# Patient Record
Sex: Male | Born: 1960 | Race: Black or African American | Hispanic: No | Marital: Married | Smoking: Never smoker
Health system: Southern US, Community
[De-identification: ages and names within clinical notes are randomized; demographics above are authoritative.]

---

## 2001-07-27 ENCOUNTER — Emergency Department (HOSPITAL_COMMUNITY): Admission: EM | Admit: 2001-07-27 | Discharge: 2001-07-27 | Payer: Self-pay | Admitting: Emergency Medicine

## 2003-08-28 ENCOUNTER — Emergency Department (HOSPITAL_COMMUNITY): Admission: EM | Admit: 2003-08-28 | Discharge: 2003-08-28 | Payer: Self-pay | Admitting: Emergency Medicine

## 2004-09-04 ENCOUNTER — Emergency Department (HOSPITAL_COMMUNITY): Admission: EM | Admit: 2004-09-04 | Discharge: 2004-09-05 | Payer: Self-pay | Admitting: Emergency Medicine

## 2004-09-22 ENCOUNTER — Emergency Department (HOSPITAL_COMMUNITY): Admission: EM | Admit: 2004-09-22 | Discharge: 2004-09-23 | Payer: Self-pay | Admitting: Emergency Medicine
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
## 2014-05-01 ENCOUNTER — Emergency Department (HOSPITAL_COMMUNITY)
Admission: EM | Admit: 2014-05-01 | Discharge: 2014-05-01 | Disposition: A | Discharge status: Home / Self Care | Payer: Self-pay | Attending: Emergency Medicine | Admitting: Emergency Medicine

## 2014-05-01 ENCOUNTER — Encounter (HOSPITAL_COMMUNITY): Payer: Self-pay | Admitting: Nurse Practitioner

## 2014-05-01 DIAGNOSIS — J069 Acute upper respiratory infection, unspecified: Secondary | ICD-10-CM | POA: Insufficient documentation

## 2014-05-01 MED ORDER — DM-GUAIFENESIN ER 30-600 MG PO TB12: 1.0000 | ORAL_TABLET | Freq: Two times a day (BID) | ORAL | Status: DC

## 2014-05-01 MED ORDER — ACETAMINOPHEN 325 MG PO TABS
650.0000 mg | ORAL_TABLET | Freq: Once | ORAL | Status: AC
  Administered 2014-05-01: 650 mg via ORAL
  Filled 2014-05-01: qty 2

## 2014-05-01 NOTE — Discharge Instructions (Signed)
Upper Respiratory Infection, Adult An upper respiratory infection (URI) is also sometimes known as the common cold. The upper respiratory tract includes the nose, sinuses, throat, trachea, and bronchi. Bronchi are the airways leading to the lungs. Most people improve within 1 week, but symptoms can last up to 2 weeks. A residual cough may last even longer.  CAUSES Many different viruses can infect the tissues lining the upper respiratory tract. The tissues become irritated and inflamed and often become very moist. Mucus production is also common. A cold is contagious. You can easily spread the virus to others by oral contact. This includes kissing, sharing a glass, coughing, or sneezing. Touching your mouth or nose and then touching a surface, which is then touched by another person, can also spread the virus. SYMPTOMS  Symptoms typically develop 1 to 3 days after you come in contact with a cold virus. Symptoms vary from person to person. They may include:  Runny nose.  Sneezing.  Nasal congestion.  Sinus irritation.  Sore throat.  Loss of voice (laryngitis).  Cough.  Fatigue.  Muscle aches.  Loss of appetite.  Headache.  Low-grade fever. DIAGNOSIS  You might diagnose your own cold based on familiar symptoms, since most people get a cold 2 to 3 times a year. Your caregiver can confirm this based on your exam. Most importantly, your caregiver can check that your symptoms are not due to another disease such as strep throat, sinusitis, pneumonia, asthma, or epiglottitis. Blood tests, throat tests, and X-rays are not necessary to diagnose a common cold, but they may sometimes be helpful in excluding other more serious diseases. Your caregiver will decide if any further tests are required. RISKS AND COMPLICATIONS  You may be at risk for a more severe case of the common cold if you smoke cigarettes, have chronic heart disease (such as heart failure) or lung disease (such as asthma), or if  you have a weakened immune system. The very young and very old are also at risk for more serious infections. Bacterial sinusitis, middle ear infections, and bacterial pneumonia can complicate the common cold. The common cold can worsen asthma and chronic obstructive pulmonary disease (COPD). Sometimes, these complications can require emergency medical care and may be life-threatening. PREVENTION  The best way to protect against getting a cold is to practice good hygiene. Avoid oral or hand contact with people with cold symptoms. Wash your hands often if contact occurs. There is no clear evidence that vitamin C, vitamin E, echinacea, or exercise reduces the chance of developing a cold. However, it is always recommended to get plenty of rest and practice good nutrition. TREATMENT  Treatment is directed at relieving symptoms. There is no cure. Antibiotics are not effective, because the infection is caused by a virus, not by bacteria. Treatment may include:  Increased fluid intake. Sports drinks offer valuable electrolytes, sugars, and fluids.  Breathing heated mist or steam (vaporizer or shower).  Eating chicken soup or other clear broths, and maintaining good nutrition.  Getting plenty of rest.  Using gargles or lozenges for comfort.  Controlling fevers with ibuprofen or acetaminophen as directed by your caregiver.  Increasing usage of your inhaler if you have asthma. Zinc gel and zinc lozenges, taken in the first 24 hours of the common cold, can shorten the duration and lessen the severity of symptoms. Pain medicines may help with fever, muscle aches, and throat pain. A variety of non-prescription medicines are available to treat congestion and runny nose. Your caregiver   can make recommendations and may suggest nasal or lung inhalers for other symptoms.  HOME CARE INSTRUCTIONS   Only take over-the-counter or prescription medicines for pain, discomfort, or fever as directed by your  caregiver.  Use a warm mist humidifier or inhale steam from a shower to increase air moisture. This may keep secretions moist and make it easier to breathe.  Drink enough water and fluids to keep your urine clear or pale yellow.  Rest as needed.  Return to work when your temperature has returned to normal or as your caregiver advises. You may need to stay home longer to avoid infecting others. You can also use a face mask and careful hand washing to prevent spread of the virus. SEEK MEDICAL CARE IF:   After the first few days, you feel you are getting worse rather than better.  You need your caregiver's advice about medicines to control symptoms.  You develop chills, worsening shortness of breath, or brown or red sputum. These may be signs of pneumonia.  You develop yellow or brown nasal discharge or pain in the face, especially when you bend forward. These may be signs of sinusitis.  You develop a fever, swollen neck glands, pain with swallowing, or white areas in the back of your throat. These may be signs of strep throat. SEEK IMMEDIATE MEDICAL CARE IF:   You have a fever.  You develop severe or persistent headache, ear pain, sinus pain, or chest pain.  You develop wheezing, a prolonged cough, cough up blood, or have a change in your usual mucus (if you have chronic lung disease).  You develop sore muscles or a stiff neck. Document Released: 07/18/2000 Document Revised: 04/16/2011 Document Reviewed: 04/29/2013 [employer] Patient Information 2015 [employer]. This information is not intended to replace advice given to you by your health care provider. Make sure you discuss any questions you have with your health care provider.  

## 2014-05-01 NOTE — ED Provider Notes (Signed)
CSN: [id number]     Arrival date & time 05/01/14  1041 History   First MD Initiated Contact with Patient 05/01/14 1113     Chief Complaint  Patient presents with  . URI     (Consider location/radiation/quality/duration/timing/severity/associated sxs/prior Treatment) Patient is a 54 y.o. male presenting with URI. The history is provided by the patient. No language interpreter was used.  URI Presenting symptoms: congestion, cough, rhinorrhea and sore throat   Severity:  Mild Onset quality:  Gradual Timing:  Constant Progression:  Unchanged Chronicity:  New Relieved by:  Nothing Worsened by:  Nothing tried Ineffective treatments:  None tried Associated symptoms: myalgias     History reviewed. No pertinent past medical history. History reviewed. No pertinent past surgical history. History reviewed. No pertinent family history. History  Substance Use Topics  . Smoking status: Never Smoker   . Smokeless tobacco: Not on file  . Alcohol Use: No    Review of Systems  HENT: Positive for congestion, rhinorrhea and sore throat.   Respiratory: Positive for cough.   Musculoskeletal: Positive for myalgias.  All other systems reviewed and are negative.     Allergies  Review of patient's allergies indicates no known allergies.  Home Medications   Prior to Admission medications   Medication Sig Start Date End Date Taking? Authorizing Provider  dextromethorphan-guaiFENesin (MUCINEX DM) 30-600 MG per 12 hr tablet Take 1 tablet by mouth 2 (two) times daily. 05/01/14   Teressa LowerVrinda Jacquese Hackman, NP   BP 125/81 mmHg  Pulse 102  Temp(Src) 99.5 F (37.5 C) (Oral)  Resp 18  Ht 6\' 1"  (1.854 m)  Wt 185 lb (83.915 kg)  BMI 24.41 kg/m2  SpO2 94% Physical Exam  Constitutional: He is oriented to person, place, and time. He appears well-developed and well-nourished.  HENT:  Head: Normocephalic and atraumatic.  Right Ear: External ear normal.  Left Ear: External ear normal.  Mouth/Throat:  Posterior oropharyngeal erythema present.  Cardiovascular: Normal rate and regular rhythm.   Pulmonary/Chest: Effort normal and breath sounds normal.  Musculoskeletal: Normal range of motion.  Neurological: He is alert and oriented to person, place, and time.  Skin: Skin is warm and dry.  Nursing note and vitals reviewed.   ED Course  Procedures (including critical care time) Labs Review Labs Reviewed - No data to display  Imaging Review No results found.   EKG Interpretation None      MDM   Final diagnoses:  URI (upper respiratory infection)    Consistent with viral symptoms. Will given symptomatic treatment   Teressa LowerVrinda Yazeed Pryer, NP 05/01/14 1127  Linwood DibblesJon Knapp, MD 05/02/14 [phone]

## 2014-05-01 NOTE — ED Notes (Signed)
Pt reports chills, body aches, congestion since yesterday

## 2014-09-14 ENCOUNTER — Encounter (HOSPITAL_COMMUNITY): Payer: Self-pay | Admitting: *Deleted

## 2014-09-14 ENCOUNTER — Emergency Department (HOSPITAL_COMMUNITY)
Admission: EM | Admit: 2014-09-14 | Discharge: 2014-09-14 | Disposition: A | Discharge status: Home / Self Care | Payer: BLUE CROSS/BLUE SHIELD | Attending: Emergency Medicine | Admitting: Emergency Medicine

## 2014-09-14 DIAGNOSIS — L259 Unspecified contact dermatitis, unspecified cause: Secondary | ICD-10-CM | POA: Insufficient documentation

## 2014-09-14 DIAGNOSIS — Z79899 Other long term (current) drug therapy: Secondary | ICD-10-CM | POA: Diagnosis not present

## 2014-09-14 DIAGNOSIS — L299 Pruritus, unspecified: Secondary | ICD-10-CM | POA: Diagnosis present

## 2014-09-14 MED ORDER — CEPHALEXIN 500 MG PO CAPS: 500.0000 mg | ORAL_CAPSULE | Freq: Four times a day (QID) | ORAL | Status: DC

## 2014-09-14 MED ORDER — PREDNISONE 10 MG (21) PO TBPK
10.0000 mg | ORAL_TABLET | Freq: Every day | ORAL | Status: DC
Start: 2014-09-14 — End: 2015-11-16

## 2014-09-14 MED ORDER — LORATADINE 10 MG PO TABS
10.0000 mg | ORAL_TABLET | Freq: Every day | ORAL | Status: DC
Start: 2014-09-14 — End: 2015-11-16

## 2014-09-14 NOTE — ED Notes (Signed)
Patient left with all belongings and refused wheelchair. Discharge instructions were reviewed and all questions were answered.

## 2014-09-14 NOTE — ED Provider Notes (Signed)
CSN: [id number]   Arrival date & time 09/14/14 1744  History  This chart was scribed for non-physician practitioner, Eyvonne Mechanic PA-C , working with Glynn Octave, MD by Bethel Born, ED Scribe. This patient was seen in room TR08C/TR08C and the patient's care was started at 6:49 PM.  Chief Complaint  Patient presents with  . Insect Bite    HPI The history is provided by the patient. No language interpreter was used.   Jeffrey Cline is a 54 y.o. male who presents to the Emergency Department complaining of an area of extremely itchy increasing swelling and itching above the right elbow, upper arm, forearm with onset 5 days ago. Associated symptoms include increased warmth at the area. Pt believes that he was bit by a spider because he recently moved in a new house that was previously vacant. 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 Benadryl and cortisone have provided some relief in itching at home. No pain or redness at the area, fever, chills, nausea, or vomiting. He works outside in [employer].   History reviewed. No pertinent past medical history.  History reviewed. No pertinent past surgical history.  No family history on file.  Social History  Substance Use Topics  . Smoking status: Never Smoker   . Smokeless tobacco: None  . Alcohol Use: No     Review of Systems  All other systems reviewed and are negative.   Home Medications   Prior to Admission medications   Medication Sig Start Date End Date Taking? Authorizing Provider  cephALEXin (KEFLEX) 500 MG capsule Take 1 capsule (500 mg total) by mouth 4 (four) times daily. 09/14/14   Eyvonne Mechanic, PA-C  dextromethorphan-guaiFENesin (MUCINEX DM) 30-600 MG per 12 hr tablet Take 1 tablet by mouth 2 (two) times daily. 05/01/14   Teressa Lower, NP  loratadine (CLARITIN) 10 MG tablet Take 1 tablet (10 mg total) by mouth daily. 09/14/14   Eyvonne Mechanic, PA-C  predniSONE  (STERAPRED UNI-PAK 21 TAB) 10 MG (21) TBPK tablet Take 1 tablet (10 mg total) by mouth daily. Take 6 tabs by mouth daily  for 2 days, then 5 tabs for 2 days, then 4 tabs for 2 days, then 3 tabs for 2 days, 2 tabs for 2 days, then 1 tab by mouth daily for 2 days 09/14/14   Eyvonne Mechanic, PA-C    Allergies  Review of patient's allergies indicates no known allergies.  Triage Vitals: BP 142/88 mmHg  Pulse 82  Temp(Src) 98.1 F (36.7 C) (Oral)  Resp 18  Ht 6\' 1"  (1.854 m)  Wt 185 lb (83.915 kg)  BMI 24.41 kg/m2  SpO2 100%  Physical Exam  Constitutional: He is oriented to person, place, and time. He appears well-developed and well-nourished.  HENT:  Head: Normocephalic.  Eyes: EOM are normal.  Neck: Normal range of motion.  Pulmonary/Chest: Effort normal.  Abdominal: He exhibits no distension.  Musculoskeletal: Normal range of motion.  Extremities equal bilaterally. No swelling.  Neurological: He is alert and oriented to person, place, and time.  Skin:  Raised linear dried vesicles to the outer aspect of the upper right arm and forearm  Psychiatric: He has a normal mood and affect.  Nursing note and vitals reviewed.   ED Course  Procedures  Labs Review- Labs Reviewed - No data to display  Imaging Review No results found.  EKG Interpretation None      MDM   Final diagnoses:  Contact dermatitis     Labs  Imaging:   Consults   Therapeutics:   Assessment: Patient's presentation likely represents a contact dermatitis. He has no significant redness or swelling that would indicate infection at this time. This rash is extremely itchy, appears to be improving. Patient will be discharged home with antihistamines, steroids, and a prescription for Keflex in the event signs of infection present. Patient is instructed to follow-up with his primary care in 3 days for reevaluation, symptoms worsen initiate antibiotics with immediate follow-up.  Plan:  I personally performed  the services described in this documentation, which was scribed in my presence. The recorded information has been reviewed and is accurate.     Eyvonne Mechanic, PA-C 09/15/14 1733  Glynn Octave, MD 09/16/14 [phone]

## 2014-09-14 NOTE — ED Notes (Signed)
Pt c/o itching after being bite by what he believe is a spider. Pt c/o swelling since saturday in the right arm after bite.

## 2014-09-14 NOTE — Discharge Instructions (Signed)
Contact Dermatitis °Contact dermatitis is a reaction to certain substances that touch the skin. Contact dermatitis can be either irritant contact dermatitis or allergic contact dermatitis. Irritant contact dermatitis does not require previous exposure to the substance for a reaction to occur. Allergic contact dermatitis only occurs if you have been exposed to the substance before. Upon a repeat exposure, your body reacts to the substance.  °CAUSES  °Many substances can cause contact dermatitis. Irritant dermatitis is most commonly caused by repeated exposure to mildly irritating substances, such as: °· Makeup. °· Soaps. °· Detergents. °· Bleaches. °· Acids. °· Metal salts, such as nickel. °Allergic contact dermatitis is most commonly caused by exposure to: °· Poisonous plants. °· Chemicals (deodorants, shampoos). °· Jewelry. °· Latex. °· Neomycin in triple antibiotic cream. °· Preservatives in products, including clothing. °SYMPTOMS  °The area of skin that is exposed may develop: °· Dryness or flaking. °· Redness. °· Cracks. °· Itching. °· Pain or a burning sensation. °· Blisters. °With allergic contact dermatitis, there may also be swelling in areas such as the eyelids, mouth, or genitals.  °DIAGNOSIS  °Your caregiver can usually tell what the problem is by doing a physical exam. In cases where the cause is uncertain and an allergic contact dermatitis is suspected, a patch skin test may be performed to help determine the cause of your dermatitis. °TREATMENT °Treatment includes protecting the skin from further contact with the irritating substance by avoiding that substance if possible. Barrier creams, powders, and gloves may be helpful. Your caregiver may also recommend: °· Steroid creams or ointments applied 2 times daily. For best results, soak the rash area in cool water for 20 minutes. Then apply the medicine. Cover the area with a plastic wrap. You can store the steroid cream in the refrigerator for a "chilly"  effect on your rash. That may decrease itching. Oral steroid medicines may be needed in more severe cases. °· Antibiotics or antibacterial ointments if a skin infection is present. °· Antihistamine lotion or an antihistamine taken by mouth to ease itching. °· Lubricants to keep moisture in your skin. °· Burow's solution to reduce redness and soreness or to dry a weeping rash. Mix one packet or tablet of solution in 2 cups cool water. Dip a clean washcloth in the mixture, wring it out a bit, and put it on the affected area. Leave the cloth in place for 30 minutes. Do this as often as possible throughout the day. °· Taking several cornstarch or baking soda baths daily if the area is too large to cover with a washcloth. °Harsh chemicals, such as alkalis or acids, can cause skin damage that is like a burn. You should flush your skin for 15 to 20 minutes with cold water after such an exposure. You should also seek immediate medical care after exposure. Bandages (dressings), antibiotics, and pain medicine may be needed for severely irritated skin.  °HOME CARE INSTRUCTIONS °· Avoid the substance that caused your reaction. °· Keep the area of skin that is affected away from hot water, soap, sunlight, chemicals, acidic substances, or anything else that would irritate your skin. °· Do not scratch the rash. Scratching may cause the rash to become infected. °· You may take cool baths to help stop the itching. °· Only take over-the-counter or prescription medicines as directed by your caregiver. °· See your caregiver for follow-up care as directed to make sure your skin is healing properly. °SEEK MEDICAL CARE IF:  °· Your condition is not better after 3   days of treatment.  You seem to be getting worse.  You see signs of infection such as swelling, tenderness, redness, soreness, or warmth in the affected area.  You have any problems related to your medicines. Document Released: 01/20/2000 Document Revised: 04/16/2011  Document Reviewed: 06/27/2010 [facility] Patient Information 2015 [place], [place]. This information is not intended to replace advice given to you by your health care provider. Make sure you discuss any questions you have with your health care provider.   Please monitor for new or worsening signs or symptoms. If signs of infection present please initiate antibiotic therapy and follow up with her primary care provider for further evaluation and management. Please return immediately if concerning signs or symptoms present.

## 2015-11-15 ENCOUNTER — Emergency Department (HOSPITAL_COMMUNITY)
Admission: EM | Admit: 2015-11-15 | Discharge: 2015-11-16 | Disposition: A | Discharge status: Home / Self Care | Payer: 59 | Attending: Emergency Medicine | Admitting: Emergency Medicine

## 2015-11-15 ENCOUNTER — Encounter (HOSPITAL_COMMUNITY): Payer: Self-pay

## 2015-11-15 DIAGNOSIS — H538 Other visual disturbances: Secondary | ICD-10-CM

## 2015-11-15 DIAGNOSIS — H5712 Ocular pain, left eye: Secondary | ICD-10-CM | POA: Diagnosis present

## 2015-11-15 DIAGNOSIS — Z791 Long term (current) use of non-steroidal anti-inflammatories (NSAID): Secondary | ICD-10-CM | POA: Diagnosis not present

## 2015-11-15 DIAGNOSIS — R519 Headache, unspecified: Secondary | ICD-10-CM

## 2015-11-15 DIAGNOSIS — R51 Headache: Secondary | ICD-10-CM | POA: Diagnosis not present

## 2015-11-15 MED ORDER — FLUORESCEIN SODIUM 1 MG OP STRP
1.0000 | ORAL_STRIP | Freq: Once | OPHTHALMIC | Status: AC
  Administered 2015-11-16: 1 via OPHTHALMIC
  Filled 2015-11-15: qty 1

## 2015-11-15 MED ORDER — TETRACAINE HCL 0.5 % OP SOLN
2.0000 [drp] | Freq: Once | OPHTHALMIC | Status: AC
  Administered 2015-11-16: 2 [drp] via OPHTHALMIC
  Filled 2015-11-15: qty 4

## 2015-11-15 NOTE — ED Provider Notes (Signed)
By signing my name below, I, Emmanuella Mensah, attest that this documentation has been prepared under the direction and in the presence of [place], DO. Electronically Signed: Judithann Sauger, ED Scribe. 11/16/15. 12:32 AM.   TIME SEEN: 12:17 AM   CHIEF COMPLAINT: Eye pain   HPI:  Jeffrey Cline is a 55 y.o. male who presents to the Emergency Department complaining of gradual onset, persistent 3/10 pain to the left eyebrow and left temple onset 8 pm yesterday. He reports associated episode of blurred vision in left eye that lasted for several minutes prior to onset of his pain. No vision loss or eye pain. Pt denies any recent head trauma. No alleviating factors noted. He has not tried any medications PTA. He denies a hx of HTN, HLD, DM, eye issues, or any heart issues. He also denies that he is current smoker. Pt has NKDA. He denies any fever, chills, numbness, weakness, or any other symptoms. No head injury. Reports that the headache came on suddenly with severe sharp pain that has decreased over time. Reports that the blurry vision started first and lasted for only 3 minutes and resolved.  ROS: See HPI Constitutional: no fever  Eyes: no drainage  ENT: no runny nose   Cardiovascular:  no chest pain  Resp: no SOB  GI: no vomiting GU: no dysuria Integumentary: no rash  Allergy: no hives  Musculoskeletal: no leg swelling  Neurological: no slurred speech ROS otherwise negative  PAST MEDICAL HISTORY/PAST SURGICAL HISTORY:  History reviewed. No pertinent past medical history.  MEDICATIONS:  Prior to Admission medications   Medication Sig Start Date End Date Taking? Authorizing Provider  cephALEXin (KEFLEX) 500 MG capsule Take 1 capsule (500 mg total) by mouth 4 (four) times daily. 09/14/14   Okey Regal, PA-C  dextromethorphan-guaiFENesin (MUCINEX DM) 30-600 MG per 12 hr tablet Take 1 tablet by mouth 2 (two) times daily. 05/01/14   Glendell Docker, NP  loratadine (CLARITIN) 10  MG tablet Take 1 tablet (10 mg total) by mouth daily. 09/14/14   Okey Regal, PA-C  predniSONE (STERAPRED UNI-PAK 21 TAB) 10 MG (21) TBPK tablet Take 1 tablet (10 mg total) by mouth daily. Take 6 tabs by mouth daily  for 2 days, then 5 tabs for 2 days, then 4 tabs for 2 days, then 3 tabs for 2 days, 2 tabs for 2 days, then 1 tab by mouth daily for 2 days 09/14/14   Okey Regal, PA-C    ALLERGIES:  No Known Allergies  SOCIAL HISTORY:  Social History  Substance Use Topics  . Smoking status: Never Smoker  . Smokeless tobacco: Not on file  . Alcohol use No    FAMILY HISTORY: History reviewed. No pertinent family history.  EXAM: BP 140/88   Pulse 70   Temp 98.1 F (36.7 C)   Resp 16   Ht _0  (1.854 m)   Wt 160 lb 1 oz (72.6 kg)   SpO2 99%   BMI 21.12 kg/m  CONSTITUTIONAL: Alert and oriented and responds appropriately to questions. Well-appearing; well-nourished HEAD: Normocephalic; no tenderness over left temporalRegion or left TMJ, no warmth or tenderness Over this area EYES: Conjunctivae clear, PERRL; 21 mm IO pressure in left eye; no corneal abrasion or ulceration with fluorescein staining; EOM intact; normal funduscopic exam, no papilledema appreciated ENT: normal nose; no rhinorrhea; moist mucous membranes NECK: Supple, no meningismus, no LAD  CARD: RRR; S1 and S2 appreciated; no murmurs, no clicks, no rubs, no gallops RESP:  Normal chest excursion without splinting or tachypnea; breath sounds clear and equal bilaterally; no wheezes, no rhonchi, no rales, no hypoxia or respiratory distress, speaking full sentences ABD/GI: Normal bowel sounds; non-distended; soft, non-tender, no rebound, no guarding, no peritoneal signs BACK:  The back appears normal and is non-tender to palpation, there is no CVA tenderness EXT: Normal ROM in all joints; non-tender to palpation; no edema; normal capillary refill; no cyanosis, no calf tenderness or swelling    SKIN: Normal color for age and  race; warm; no rash NEURO: Moves all extremities equally, sensation to light touch intact diffusely, cranial nerves II through XII intact; strength 5/5 in all 4 extremities. Normal gait. PSYCH: The patient's mood and manner are appropriate. Grooming and personal hygiene are appropriate.  MEDICAL DECISION MAKING: Patient here with sudden onset left blurry vision. No vision loss or flashes, floaters. This lasted for several minutes and then resolved. Afterwards had left-sided headache. Has never had similar headache. Describes as sudden onset, severe but is improving. Will obtain CT of his head to evaluate for aneurysm, intracranial hemorrhage given he is within a 6 hour window of onset of symptoms. Temporal arteritis is also on the differential. Patient denies history of chronic headaches, migraines. No other neurologic deficits on exam. He is low risk for TIA, CVA and is now neurologically intact with an NIH stroke scale of 0.  ED PROGRESS: 2:20 AM  Pt's labs are unremarkable. EKG shows no arrhythmia, ischemic changes. CT scan shows no aneurysm, high-grade stenosis, intracranial hemorrhage or infarct. D/w Dr. Shon Hale with Neuro hospitatlist.  Appreciate his help.  He does not feel that this is temporal arteritis given sudden onset. ESR and CRP are pending. Does recommend outpatient neurology follow-up.  Does not think patient needs admission for a TIA workup and I agree given he is low risk. I will start him on a daily aspirin and put an order in for a outpatient follow-up with [facility] neurology.   Patient's headache is now gone after Toradol. ESR is 1.  Recommended close outpatient follow-up with neurology. Wife is also requesting that we give him outpatient resources for a PCP, ophthalmologist as well as a dentist. We'll give him this outpatient follow-up information. Have discussed with patient and wife at length return precautions. They're comfortable with this plan.   At this time, I do not feel  there is any life-threatening condition present. I have reviewed and discussed all results (EKG, imaging, lab, urine as appropriate), exam findings with patient/family. I have reviewed nursing notes and appropriate previous records.  I feel the patient is safe to be discharged home without further emergent workup and can continue workup as an outpatient as needed. Discussed usual and customary return precautions. Patient/family verbalize understanding and are comfortable with this plan.  Outpatient follow-up has been provided. All questions have been answered.      EKG Interpretation  Date/Time:  Wednesday November 16 2015 00:45:45 EDT Ventricular Rate:  59 PR Interval:    QRS Duration: 96 QT Interval:  389 QTC Calculation: 386 R Axis:   80 Text Interpretation:  Sinus rhythm ST elev, probable normal early repol pattern No old tracing to compare Confirmed by Leoncio Hansen,  DO, Ceirra Belli ([id number]) on 11/16/2015 2:32:27 AM      I personally performed the services described in this documentation, which was scribed in my presence. The recorded information has been reviewed and is accurate.    Meire Grove, DO 11/16/15 [phone]

## 2015-11-15 NOTE — ED Triage Notes (Addendum)
Pt states that around 3 hours ago he started having blurry vision in his left eye. He states that the blurry vision has since cleared up, but now he is experiencing a sharp pain above and in his left eye. Denies hx of migraines, denies any other eye issues. He ambulated to triage with a steady gait. Pt also mentioned some transient nausea this evening that has resolved at this time. Denies any unilateral weakness or numbness, denies any confusion, or other stroke-like symptoms. A&Ox4.

## 2015-11-16 ENCOUNTER — Emergency Department (HOSPITAL_COMMUNITY): Payer: 59

## 2015-11-16 ENCOUNTER — Encounter (HOSPITAL_COMMUNITY): Payer: Self-pay

## 2015-11-16 LAB — CBC WITH DIFFERENTIAL/PLATELET
BASOS PCT: 0 %
Basophils Absolute: 0 10*3/uL (ref 0.0–0.1)
EOS ABS: 0.2 10*3/uL (ref 0.0–0.7)
EOS PCT: 3 %
HCT: 42.6 % (ref 39.0–52.0)
HEMOGLOBIN: 14.4 g/dL (ref 13.0–17.0)
LYMPHS ABS: 1.8 10*3/uL (ref 0.7–4.0)
Lymphocytes Relative: 34 %
MCH: 28.2 pg (ref 26.0–34.0)
MCHC: 33.8 g/dL (ref 30.0–36.0)
MCV: 83.4 fL (ref 78.0–100.0)
MONO ABS: 0.4 10*3/uL (ref 0.1–1.0)
MONOS PCT: 7 %
Neutro Abs: 3 10*3/uL (ref 1.7–7.7)
Neutrophils Relative %: 56 %
PLATELETS: 279 10*3/uL (ref 150–400)
RBC: 5.11 MIL/uL (ref 4.22–5.81)
RDW: 14 % (ref 11.5–15.5)
WBC: 5.3 10*3/uL (ref 4.0–10.5)

## 2015-11-16 LAB — BASIC METABOLIC PANEL
Anion gap: 6 (ref 5–15)
BUN: 15 mg/dL (ref 6–20)
CALCIUM: 9.3 mg/dL (ref 8.9–10.3)
CHLORIDE: 107 mmol/L (ref 101–111)
CO2: 26 mmol/L (ref 22–32)
CREATININE: 1.02 mg/dL (ref 0.61–1.24)
GFR calc Af Amer: >60 mL/min (ref >60)
GFR calc non Af Amer: >60 mL/min (ref >60)
Glucose, Bld: 121 mg/dL — ABNORMAL HIGH (ref 65–99)
Potassium: 4.4 mmol/L (ref 3.5–5.1)
SODIUM: 139 mmol/L (ref 135–145)

## 2015-11-16 LAB — I-STAT CHEM 8, ED
BUN: 14 mg/dL (ref 6–20)
CALCIUM ION: 1.21 mmol/L (ref 1.15–1.40)
CREATININE: 1.1 mg/dL (ref 0.61–1.24)
Chloride: 104 mmol/L (ref 101–111)
Glucose, Bld: 116 mg/dL — ABNORMAL HIGH (ref 65–99)
HCT: 45 % (ref 39.0–52.0)
HEMOGLOBIN: 15.3 g/dL (ref 13.0–17.0)
Potassium: 4.4 mmol/L (ref 3.5–5.1)
SODIUM: 140 mmol/L (ref 135–145)
TCO2: 28 mmol/L (ref 0–100)

## 2015-11-16 LAB — C-REACTIVE PROTEIN

## 2015-11-16 LAB — SEDIMENTATION RATE: Sed Rate: 1 mm/hr (ref 0–16)

## 2015-11-16 MED ORDER — SODIUM CHLORIDE 0.9 % IV BOLUS (SEPSIS)
1000.0000 mL | Freq: Once | INTRAVENOUS | Status: AC
  Administered 2015-11-16: 1000 mL via INTRAVENOUS

## 2015-11-16 MED ORDER — METHYLPREDNISOLONE SODIUM SUCC 125 MG IJ SOLR
125.0000 mg | Freq: Once | INTRAMUSCULAR | Status: DC
Start: 2015-11-16 — End: 2015-11-16

## 2015-11-16 MED ORDER — IOPAMIDOL (ISOVUE-370) INJECTION 76%
100.0000 mL | Freq: Once | INTRAVENOUS | Status: AC | PRN
  Administered 2015-11-16: 100 mL via INTRAVENOUS

## 2015-11-16 MED ORDER — ASPIRIN 81 MG PO CHEW
324.0000 mg | CHEWABLE_TABLET | Freq: Once | ORAL | Status: AC
  Administered 2015-11-16: 324 mg via ORAL
  Filled 2015-11-16: qty 4

## 2015-11-16 MED ORDER — ASPIRIN 81 MG PO CHEW: 81.0000 mg | CHEWABLE_TABLET | Freq: Every day | ORAL | 1 refills | Status: AC

## 2015-11-16 MED ORDER — KETOROLAC TROMETHAMINE 30 MG/ML IJ SOLN
30.0000 mg | Freq: Once | INTRAMUSCULAR | Status: AC
  Administered 2015-11-16: 30 mg via INTRAVENOUS
  Filled 2015-11-16: qty 1

## 2015-11-16 NOTE — ED Notes (Signed)
RN will draw blood work and start and IV line

## 2015-11-16 NOTE — Discharge Instructions (Signed)
To find a primary care or specialty doctor please call [phone] or [phone] to access "[facility] Find a Doctor Service." ° °You may also go on the [facility] website at [email or web address] ° °There are also multiple [facility] and [facility] practices throughout the [place] that are frequently accepting new patients. You may find a clinic that is close to your home and contact them. ° °[facility] -  °[address] °[place] [address] °[phone] ° °[facility] in [place] (also locations in [place] and [place]) -  °[address] °[place] [address] [phone] ° °[facility] -  °[address] °[place] [address] [phone] ° ° °

## 2017-06-06 ENCOUNTER — Encounter (INDEPENDENT_AMBULATORY_CARE_PROVIDER_SITE_OTHER): Payer: Self-pay | Admitting: Orthopedic Surgery

## 2017-06-06 ENCOUNTER — Ambulatory Visit (INDEPENDENT_AMBULATORY_CARE_PROVIDER_SITE_OTHER): Payer: Worker's Compensation | Admitting: Orthopedic Surgery

## 2017-06-06 DIAGNOSIS — M25871 Other specified joint disorders, right ankle and foot: Secondary | ICD-10-CM

## 2017-06-06 MED ORDER — LIDOCAINE HCL 1 % IJ SOLN
2.0000 mL | INTRAMUSCULAR | Status: AC | PRN
  Administered 2017-06-06: 2 mL

## 2017-06-06 MED ORDER — METHYLPREDNISOLONE ACETATE 40 MG/ML IJ SUSP
40.0000 mg | INTRAMUSCULAR | Status: AC | PRN
  Administered 2017-06-06: 40 mg via INTRA_ARTICULAR

## 2017-06-06 NOTE — Progress Notes (Signed)
Office Visit Note   Patient: Jeffrey Cline           Date of Birth: Apr 18, 1960           MRN: [id number] Visit Date: 06/06/2017              Requested by: Katharina Caper, NP [address] [place], [place] [address] PCP: Patient, No Pcp Per  Chief Complaint  Patient presents with  . Right Ankle - Pain      HPI: Patient is a 57 year old gentleman who was seen for medical evaluation for his right ankle.  Patient states he sustained a supination external rotation sprain on March 22, 2017 of this year.  Patient has had persistent pain over the anterior lateral joint line.  He has used bracing physical therapy anti-inflammatories without relief.  Patient states that he is on light duty work secondary to the work-related injury.  Assessment & Plan: Visit Diagnoses:  1. Impingement of right ankle joint     Plan: The ankle was injected from the anterior medial portal discussed that this should relieve his impingement symptoms.  Discussed that if he is still symptomatic arthroscopic debridement of the impingement may be an option.  His ankle is stable there is no ligamentous laxity no indication of any instability of the ankle.  There are no osteochondral defects of the joint no signs of any articular cartilage injury.  Patient may advance his activities as tolerated and resume work as symptoms improve.  Follow-Up Instructions: Return if symptoms worsen or fail to improve.   Ortho Exam  Patient is alert, oriented, no adenopathy, well-dressed, normal affect, normal respiratory effort. Examination patient has a good dorsalis pedis and posterior tibial pulse.  Patient has slight anterior drawer laxity on the left uninvolved ankle on the right ankle his anterior drawer is stable with no laxity.  Patient has tenderness to palpation anteriorly and posteriorly over the joint line.  The ankle ligaments are minimally tender to palpation the deltoid ligament is nontender to palpation the  peroneal posterior tibial tendon and Achilles tendon are nontender to palpation.  Patient has good stability of the subtalar joint.  The MRI scan is reviewed which shows no subtalar joint injury there is no injury to the ankle joint there is no effusion has no injury to the peroneals posterior tibial tendon or Achilles tendon.  Imaging: No results found. No images are attached to the encounter.  Labs: Lab Results  Component Value Date   ESRSEDRATE 1 11/16/2015   CRP <0.8 11/16/2015    No results found for: HGBA1C  There is no height or weight on file to calculate BMI.  Orders:  No orders of the defined types were placed in this encounter.  No orders of the defined types were placed in this encounter.    Procedures: Medium Joint Inj: R ankle on 06/06/2017 5:51 PM Indications: pain and diagnostic evaluation Details: 22 G 1.5 in needle, anteromedial approach Medications: 2 mL lidocaine 1 %; 40 mg methylPREDNISolone acetate 40 MG/ML Outcome: tolerated well, no immediate complications Procedure, treatment alternatives, risks and benefits explained, specific risks discussed. Consent was given by the patient. Immediately prior to procedure a time out was called to verify the correct patient, procedure, equipment, support staff and site/side marked as required. Patient was prepped and draped in the usual sterile fashion.      Clinical Data: No additional findings.  ROS:  All other systems negative, except as noted in the HPI. Review  of Systems  Objective: Vital Signs: There were no vitals taken for this visit.  Specialty Comments:  No specialty comments available.  PMFS History: There are no active problems to display for this patient.  History reviewed. No pertinent past medical history.  History reviewed. No pertinent family history.  History reviewed. No pertinent surgical history. Social History   Occupational History  . Not on file  Tobacco Use  . Smoking status:  Never Smoker  Substance and Sexual Activity  . Alcohol use: No  . Drug use: No  . Sexual activity: Not on file

## 2017-07-18 ENCOUNTER — Encounter (INDEPENDENT_AMBULATORY_CARE_PROVIDER_SITE_OTHER): Payer: Self-pay | Admitting: Orthopedic Surgery

## 2017-07-18 ENCOUNTER — Ambulatory Visit (INDEPENDENT_AMBULATORY_CARE_PROVIDER_SITE_OTHER): Payer: Worker's Compensation | Admitting: Orthopedic Surgery

## 2017-07-18 VITALS — Ht 73.0 in | Wt 160.0 lb

## 2017-07-18 DIAGNOSIS — M25871 Other specified joint disorders, right ankle and foot: Secondary | ICD-10-CM

## 2017-07-18 NOTE — Progress Notes (Signed)
   Office Visit Note   Patient: Jeffrey Cline           Date of Birth: [birth year]           MRN: [id number] Visit Date: 07/18/2017              Requested by: No referring provider defined for this encounter. PCP: Patient, No Pcp Per  Chief Complaint  Patient presents with  . Right Ankle - Follow-up    S/p injection 06/06/17      HPI: Patient is a 57 year old gentleman who is status post ankle sprain from a work-related injury he underwent a steroid injection last evaluation and states his ankle does feel much better after the injection.  Patient is going to physical therapy.  Assessment & Plan: Visit Diagnoses:  1. Impingement of right ankle joint     Plan: Recommended knee-high compression stocking to help with the residual swelling discussed that he can increase his activities as he feels comfortable he has no restrictions.  Discussed that if he has recurrent symptoms that we could consider arthroscopic intervention.  Follow-Up Instructions: Return if symptoms worsen or fail to improve.   Ortho Exam  Patient is alert, oriented, no adenopathy, well-dressed, normal affect, normal respiratory effort. Examination patient has a normal gait.  He has a good dorsalis pedis and posterior tibial pulse good range of motion of the ankle and subtalar joint is a little bit of swelling anteriorly over the ankle the tendons are nontender to palpation the ankle joint is nontender to palpation.  Imaging: No results found. No images are attached to the encounter.  Labs: Lab Results  Component Value Date   ESRSEDRATE 1 11/16/2015   CRP <0.8 11/16/2015     No results found for: ALBUMIN, PREALBUMIN, LABURIC  Body mass index is 21.11 kg/m.  Orders:  No orders of the defined types were placed in this encounter.  No orders of the defined types were placed in this encounter.    Procedures: No procedures performed  Clinical Data: No additional findings.  ROS:  All other  systems negative, except as noted in the HPI. Review of Systems  Objective: Vital Signs: Ht 6\' 1"  (1.854 m)   Wt 160 lb (72.6 kg)   BMI 21.11 kg/m   Specialty Comments:  No specialty comments available.  PMFS History: There are no active problems to display for this patient.  History reviewed. No pertinent past medical history.  History reviewed. No pertinent family history.  History reviewed. No pertinent surgical history. Social History   Occupational History  . Not on file  Tobacco Use  . Smoking status: Never Smoker  . Smokeless tobacco: Never Used  Substance and Sexual Activity  . Alcohol use: No  . Drug use: No  . Sexual activity: Not on file

## 2017-07-26 ENCOUNTER — Telehealth (INDEPENDENT_AMBULATORY_CARE_PROVIDER_SITE_OTHER): Payer: Self-pay | Admitting: Orthopedic Surgery

## 2017-07-26 NOTE — Telephone Encounter (Signed)
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  Pt requested a call back before note is written  [phone]

## 2017-07-29 ENCOUNTER — Telehealth (INDEPENDENT_AMBULATORY_CARE_PROVIDER_SITE_OTHER): Payer: Self-pay | Admitting: Orthopedic Surgery

## 2017-07-29 ENCOUNTER — Telehealth (INDEPENDENT_AMBULATORY_CARE_PROVIDER_SITE_OTHER): Payer: Self-pay

## 2017-07-29 NOTE — Telephone Encounter (Signed)
Faxed the may and June office notes to the w[facility] adj per her request Fax#[phone]

## 2017-07-29 NOTE — Telephone Encounter (Signed)
Called and lm on vm to advise that per the last office visit on 07/18/17 Dr. Lajoyce Cornersuda said to increased activity as tolerated and no restrictions. If he is feeling like he is unable to preform his work duties due to pain from the ankle I would want him to come in and see Dr. Lajoyce Cornersuda there is no follow up on file. He at the last visit had said to follow up is s/s fail to improve or worsen. We are happy to see him in the office and evaluate what changes have taken place.

## 2017-07-29 NOTE — Telephone Encounter (Signed)
Patient called asked for a call back concerning work restrictions. The number to contact patient is [phone]

## 2017-07-29 NOTE — Telephone Encounter (Signed)
I called pt back and he states that he was not aware that his restrictions had been lifted and wanted a copy of his office visit note which was mailed to him.

## 2017-08-07 ENCOUNTER — Ambulatory Visit (INDEPENDENT_AMBULATORY_CARE_PROVIDER_SITE_OTHER): Payer: Self-pay | Admitting: Family

## 2017-09-03 ENCOUNTER — Ambulatory Visit (INDEPENDENT_AMBULATORY_CARE_PROVIDER_SITE_OTHER): Payer: Self-pay | Admitting: Orthopedic Surgery

## 2017-09-11 ENCOUNTER — Telehealth (INDEPENDENT_AMBULATORY_CARE_PROVIDER_SITE_OTHER): Payer: Self-pay

## 2017-09-11 NOTE — Telephone Encounter (Signed)
Received vm from Tiffany requesting updated notes on pt and next appt time. I called her back and had to leave vm advising pt was last seen in June and no showed 2 appts in July. No other appts scheduled at this time.

## 2017-09-17 ENCOUNTER — Telehealth (INDEPENDENT_AMBULATORY_CARE_PROVIDER_SITE_OTHER): Payer: Self-pay

## 2017-09-17 NOTE — Telephone Encounter (Signed)
[facility] adj called again to request notes since 07/18/17 . Advised her that we have not seen pt since then and that he no showed the last 2 office visits and that I advised her of this on 09/11/17 when she called then asking for notes

## 2021-01-31 ENCOUNTER — Emergency Department (HOSPITAL_COMMUNITY)
Admission: EM | Admit: 2021-01-31 | Discharge: 2021-01-31 | Disposition: A | Discharge status: Home / Self Care | Payer: BLUE CROSS/BLUE SHIELD | Attending: Emergency Medicine | Admitting: Emergency Medicine

## 2021-01-31 ENCOUNTER — Other Ambulatory Visit: Payer: Self-pay

## 2021-01-31 DIAGNOSIS — Z20822 Contact with and (suspected) exposure to covid-19: Secondary | ICD-10-CM | POA: Insufficient documentation

## 2021-01-31 DIAGNOSIS — R531 Weakness: Secondary | ICD-10-CM | POA: Insufficient documentation

## 2021-01-31 DIAGNOSIS — J101 Influenza due to other identified influenza virus with other respiratory manifestations: Secondary | ICD-10-CM | POA: Insufficient documentation

## 2021-01-31 DIAGNOSIS — Z7982 Long term (current) use of aspirin: Secondary | ICD-10-CM | POA: Insufficient documentation

## 2021-01-31 LAB — RESP PANEL BY RT-PCR (FLU A&B, COVID) ARPGX2
Influenza A by PCR: POSITIVE — AB
Influenza B by PCR: NEGATIVE
SARS Coronavirus 2 by RT PCR: NEGATIVE

## 2021-01-31 NOTE — ED Provider Notes (Signed)
[facility] EMERGENCY DEPARTMENT Provider Note   CSN: [id number] Arrival date & time: 01/31/21  1139     History Chief Complaint  Patient presents with   Cough    Jeffrey Cline is a 60 y.o. male.  HPI  60 year old male with no admitted past medical history presents emergency department with 5 to 6 days of generalized illness.  States that it started with intermittently productive cough, weakness, fever/chills denies any GI symptoms.  Recently he has had a mild cough, his fever has resolved.  He continues to have weakness and generalized fatigue.  Slight decrease in appetite but no nausea.  No past medical history on file.  There are no problems to display for this patient.   No past surgical history on file.     No family history on file.  Social History   Tobacco Use   Smoking status: Never   Smokeless tobacco: Never  Substance Use Topics   Alcohol use: No   Drug use: No    Home Medications Prior to Admission medications   Medication Sig Start Date End Date Taking? Authorizing Provider  aspirin 81 MG chewable tablet Chew 1 tablet (81 mg total) by mouth daily. 11/16/15   Ward, Layla Maw, DO  ibuprofen (ADVIL,MOTRIN) 200 MG tablet Take 400 mg by mouth every 6 (six) hours as needed for moderate pain.    [provider]    Allergies    Milk-related compounds  Review of Systems   Review of Systems  Constitutional:  Positive for appetite change, chills, fatigue and fever.  HENT:  Positive for congestion.   Respiratory:  Positive for cough. Negative for shortness of breath.   Cardiovascular:  Negative for chest pain.  Gastrointestinal:  Negative for abdominal pain, diarrhea, nausea and vomiting.  Musculoskeletal:  Negative for back pain.  Skin:  Negative for rash.  Neurological:  Negative for headaches.   Physical Exam Updated Vital Signs BP 136/86 (BP Location: Right Arm)    Pulse 76    Temp 98.9 F (37.2 C) (Oral)    Resp 18     SpO2 100%   Physical Exam  ED Results / Procedures / Treatments   Labs (all labs ordered are listed, but only abnormal results are displayed) Labs Reviewed  RESP PANEL BY RT-PCR (FLU A&B, COVID) ARPGX2 - Abnormal; Notable for the following components:      Result Value   Influenza A by PCR POSITIVE (*)    All other components within normal limits    EKG None  Radiology No results found.  Procedures Procedures   Medications Ordered in ED Medications - No data to display  ED Course  I have reviewed the triage vital signs and the nursing notes.  Pertinent labs & imaging results that were available during my care of the patient were reviewed by me and considered in my medical decision making (see chart for details).    MDM Rules/Calculators/A&P                          60 year old male presents emergency department generalized illness for the past 5 days.  Vitals are stable on arrival.  He is influenza a positive.  No indication for Tamiflu at this time.  He is already improving.  Plan for symptomatic over-the-counter medication with strict return to ED precautions.  Patient at this time appears safe and stable for discharge and will be treated as an outpatient.  Discharge plan and strict return to ED precautions discussed, patient verbalizes understanding and agreement.     Final Clinical Impression(s) / ED Diagnoses Final diagnoses:  Influenza A    Rx / DC Orders ED Discharge Orders     None        Rozelle Logan, DO 01/31/21 1802

## 2021-01-31 NOTE — Discharge Instructions (Addendum)
You have been seen and discharged from the emergency department.  You have been diagnosed with influenza.  Treat yourself with over-the-counter medication.  Stay well-hydrated.  You may return to work once you are fever free or after 10 days.  Follow-up with your primary provider for reevaluation and further care. Take home medications as prescribed. If you have any worsening symptoms or further concerns for your health please return to an emergency department for further evaluation.

## 2021-01-31 NOTE — ED Triage Notes (Signed)
Pt reports generalized weakness, cough, and hot flashes since Thursday. No cough or fever today, just continues to have malaise.
